# Patient Record
Sex: Male | Born: 1960 | Race: Black or African American | Hispanic: No | Marital: Single | Smoking: Never smoker
Health system: Southern US, Community
[De-identification: ages and names within clinical notes are randomized; demographics above are authoritative.]

## PROBLEM LIST (undated history)

## (undated) DIAGNOSIS — M5030 Other cervical disc degeneration, unspecified cervical region: Secondary | ICD-10-CM

## (undated) DIAGNOSIS — E785 Hyperlipidemia, unspecified: Secondary | ICD-10-CM

## (undated) HISTORY — DX: Hyperlipidemia, unspecified: E78.5

---
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
## 2009-04-26 ENCOUNTER — Emergency Department: Payer: Self-pay | Admitting: Emergency Medicine

## 2011-10-17 ENCOUNTER — Emergency Department: Payer: Self-pay | Admitting: Emergency Medicine

## 2014-02-08 ENCOUNTER — Ambulatory Visit: Payer: Self-pay | Admitting: Family Medicine

## 2014-09-20 ENCOUNTER — Telehealth: Payer: Self-pay | Admitting: Family Medicine

## 2014-09-20 DIAGNOSIS — E7800 Pure hypercholesterolemia, unspecified: Secondary | ICD-10-CM

## 2014-09-20 DIAGNOSIS — M779 Enthesopathy, unspecified: Secondary | ICD-10-CM

## 2014-09-20 NOTE — Telephone Encounter (Signed)
Requesting refill on meloxicam and lovastatin. Please send to [facility]. Please inform patient when this has been completed [phone]

## 2014-09-26 MED ORDER — MELOXICAM 15 MG PO TABS: 15.0000 mg | ORAL_TABLET | Freq: Every day | ORAL | Status: DC

## 2014-09-26 MED ORDER — LOVASTATIN 40 MG PO TABS: 40.0000 mg | ORAL_TABLET | Freq: Every day | ORAL | Status: DC

## 2014-09-26 NOTE — Telephone Encounter (Signed)
Sent enough for 1 month to pharmacy pt must RTO for refills as he has not been here since 03/2014.

## 2014-09-29 NOTE — Telephone Encounter (Signed)
Have tried contacting the patient multiple times and no response.

## 2014-10-10 ENCOUNTER — Telehealth: Payer: Self-pay | Admitting: Family Medicine

## 2014-10-10 ENCOUNTER — Telehealth: Payer: Self-pay | Admitting: Emergency Medicine

## 2014-10-10 DIAGNOSIS — E7800 Pure hypercholesterolemia, unspecified: Secondary | ICD-10-CM

## 2014-10-10 DIAGNOSIS — M779 Enthesopathy, unspecified: Secondary | ICD-10-CM

## 2014-10-10 MED ORDER — LOVASTATIN 40 MG PO TABS: 40.0000 mg | ORAL_TABLET | Freq: Every day | ORAL | Status: DC

## 2014-10-10 MED ORDER — MELOXICAM 15 MG PO TABS: 15.0000 mg | ORAL_TABLET | Freq: Every day | ORAL | Status: DC

## 2014-10-10 NOTE — Telephone Encounter (Signed)
Patient notified script is ready for pick up

## 2014-10-10 NOTE — Telephone Encounter (Signed)
Pt called needing a refill on the following medications:  1. Lovastatin 40mg  2. Meloxicam 15mg   Pt uses the [facility] on [address]. Pt stated that the pharmacy never received the refill from 09/26/14.

## 2015-03-13 ENCOUNTER — Encounter: Payer: Self-pay | Admitting: Family Medicine

## 2015-03-13 ENCOUNTER — Ambulatory Visit (INDEPENDENT_AMBULATORY_CARE_PROVIDER_SITE_OTHER): Payer: Managed Care, Other (non HMO) | Admitting: Family Medicine

## 2015-03-13 VITALS — BP 120/72 | HR 72 | Temp 98.8°F | Resp 18 | Ht 68.0 in | Wt 177.7 lb

## 2015-03-13 DIAGNOSIS — M25569 Pain in unspecified knee: Secondary | ICD-10-CM | POA: Diagnosis not present

## 2015-03-13 DIAGNOSIS — Z1211 Encounter for screening for malignant neoplasm of colon: Secondary | ICD-10-CM

## 2015-03-13 DIAGNOSIS — Z23 Encounter for immunization: Secondary | ICD-10-CM

## 2015-03-13 DIAGNOSIS — E785 Hyperlipidemia, unspecified: Secondary | ICD-10-CM | POA: Diagnosis not present

## 2015-03-13 DIAGNOSIS — M779 Enthesopathy, unspecified: Secondary | ICD-10-CM | POA: Diagnosis not present

## 2015-03-13 DIAGNOSIS — E7800 Pure hypercholesterolemia, unspecified: Secondary | ICD-10-CM | POA: Diagnosis not present

## 2015-03-13 DIAGNOSIS — Z0000 Encounter for general adult medical examination without abnormal findings: Secondary | ICD-10-CM | POA: Diagnosis not present

## 2015-03-13 MED ORDER — MELOXICAM 15 MG PO TABS: 15.0000 mg | ORAL_TABLET | Freq: Every day | ORAL | Status: DC

## 2015-03-13 MED ORDER — LOVASTATIN 40 MG PO TABS: 40.0000 mg | ORAL_TABLET | Freq: Every day | ORAL | Status: DC

## 2015-03-13 NOTE — Progress Notes (Signed)
Name: Juventino SlovakMark A Hartfield   MRN: [id number]    DOB: 08/21/1960   Date:03/13/2015       Progress Note  Subjective  Chief Complaint  Chief Complaint  Patient presents with  . Annual Exam    HPI  54 year old presents for annual H&P. Baseline problems are stable  Past Medical History  Diagnosis Date  . Hyperlipidemia     Social History  Substance Use Topics  . Smoking status: Never Smoker   . Smokeless tobacco: Not on file  . Alcohol Use: 0.0 oz/week    0 Standard drinks or equivalent per week     Current outpatient prescriptions:  .  lovastatin (MEVACOR) 40 MG tablet, Take 1 tablet (40 mg total) by mouth at bedtime., Disp: 30 tablet, Rfl: 2 .  meloxicam (MOBIC) 15 MG tablet, Take 1 tablet (15 mg total) by mouth daily., Disp: 30 tablet, Rfl: 2  No Known Allergies  Review of Systems  Constitutional: Negative for fever, chills and weight loss.  HENT: Negative for congestion, hearing loss, sore throat and tinnitus.   Eyes: Negative for blurred vision, double vision and redness.  Respiratory: Negative for cough, hemoptysis and shortness of breath.   Cardiovascular: Negative for chest pain, palpitations, orthopnea, claudication and leg swelling.  Gastrointestinal: Negative for heartburn, nausea, vomiting, diarrhea, constipation and blood in stool.  Genitourinary: Negative for dysuria, urgency, frequency and hematuria.  Musculoskeletal: Negative for myalgias, back pain, joint pain, falls and neck pain.  Skin: Negative for itching.  Neurological: Negative for dizziness, tingling, tremors, focal weakness, seizures, loss of consciousness, weakness and headaches.  Endo/Heme/Allergies: Does not bruise/bleed easily.  Psychiatric/Behavioral: Negative for depression and substance abuse. The patient is not nervous/anxious and does not have insomnia.      Objective  Filed Vitals:   03/13/15 0931  BP: 120/72  Pulse: 72  Temp: 98.8 F (37.1 C)  TempSrc: Oral  Resp: 18  Height: 5\' 8"   (1.727 m)  Weight: 177 lb 11.2 oz (80.604 kg)  SpO2: 97%     Physical Exam  Constitutional: He is oriented to person, place, and time and well-developed, well-nourished, and in no distress.  HENT:  Head: Normocephalic.  Eyes: EOM are normal. Pupils are equal, round, and reactive to light.  Neck: Normal range of motion. Neck supple. No thyromegaly present.  Cardiovascular: Normal rate, regular rhythm and normal heart sounds.   No murmur heard. Pulmonary/Chest: Effort normal and breath sounds normal. No respiratory distress. He has no wheezes.  Abdominal: Soft. Bowel sounds are normal.  Genitourinary: Rectum normal, prostate normal and penis normal. Guaiac negative stool. No discharge found.  Musculoskeletal: Normal range of motion. He exhibits no edema.  Lymphadenopathy:    He has no cervical adenopathy.  Neurological: He is alert and oriented to person, place, and time. No cranial nerve deficit. Gait normal. Coordination normal.  Skin: Skin is warm and dry. No rash noted.  Psychiatric: Affect and judgment normal.      Assessment & Plan   1. Annual physical exam  - POC Hemoccult Bld/Stl (1-Cd Office Dx) - CBC - Comprehensive Metabolic Panel (CMET) - Lipid panel - TSH - PSA  2. Hyperlipidemia  - Lipid panel  3. Knee pain, unspecified laterality Stable  4. Colon cancer screening  - Ambulatory referral to Gastroenterology  5. Need for influenza vaccination Given - Flu Vaccine QUAD 36+ mos PF IM (Fluarix & Fluzone Quad PF)  6. Hypercholesteremia Labs - lovastatin (MEVACOR) 40 MG tablet; Take 1 tablet (40  mg total) by mouth at bedtime.  Dispense: 30 tablet; Refill: 5  7. Tendonitis Renew meloxicam. Clinic AND was treated - meloxicam (MOBIC) 15 MG tablet; Take 1 tablet (15 mg total) by mouth daily.  Dispense: 30 tablet; Refill: 5

## 2015-03-14 LAB — LIPID PANEL
CHOLESTEROL TOTAL: 196 mg/dL (ref 100–199)
Chol/HDL Ratio: 4.4 ratio units (ref 0.0–5.0)
HDL: 45 mg/dL (ref >39)
LDL Calculated: 133 mg/dL — ABNORMAL HIGH (ref 0–99)
TRIGLYCERIDES: 90 mg/dL (ref 0–149)
VLDL CHOLESTEROL CAL: 18 mg/dL (ref 5–40)

## 2015-03-14 LAB — COMPREHENSIVE METABOLIC PANEL
A/G RATIO: 2 (ref 1.1–2.5)
ALBUMIN: 4.5 g/dL (ref 3.5–5.5)
ALK PHOS: 57 IU/L (ref 39–117)
ALT: 37 IU/L (ref 0–44)
AST: 29 IU/L (ref 0–40)
BILIRUBIN TOTAL: 0.7 mg/dL (ref 0.0–1.2)
BUN/Creatinine Ratio: 14 (ref 9–20)
BUN: 17 mg/dL (ref 6–24)
CO2: 26 mmol/L (ref 18–29)
CREATININE: 1.25 mg/dL (ref 0.76–1.27)
Calcium: 9.1 mg/dL (ref 8.7–10.2)
Chloride: 100 mmol/L (ref 96–106)
GFR calc Af Amer: 76 mL/min/{1.73_m2} (ref >59)
GFR calc non Af Amer: 66 mL/min/{1.73_m2} (ref >59)
GLUCOSE: 97 mg/dL (ref 65–99)
Globulin, Total: 2.2 g/dL (ref 1.5–4.5)
Potassium: 4.5 mmol/L (ref 3.5–5.2)
Sodium: 140 mmol/L (ref 134–144)
TOTAL PROTEIN: 6.7 g/dL (ref 6.0–8.5)

## 2015-03-14 LAB — CBC
HEMOGLOBIN: 15.2 g/dL (ref 12.6–17.7)
Hematocrit: 44.2 % (ref 37.5–51.0)
MCH: 29 pg (ref 26.6–33.0)
MCHC: 34.4 g/dL (ref 31.5–35.7)
MCV: 84 fL (ref 79–97)
Platelets: 185 10*3/uL (ref 150–379)
RBC: 5.24 x10E6/uL (ref 4.14–5.80)
RDW: 14.3 % (ref 12.3–15.4)
WBC: 6.7 10*3/uL (ref 3.4–10.8)

## 2015-03-14 LAB — TSH: TSH: 2.66 u[IU]/mL (ref 0.450–4.500)

## 2015-03-14 LAB — PSA: PROSTATE SPECIFIC AG, SERUM: 2 ng/mL (ref 0.0–4.0)

## 2015-03-17 ENCOUNTER — Other Ambulatory Visit: Payer: Self-pay

## 2015-03-17 ENCOUNTER — Telehealth: Payer: Self-pay | Admitting: Gastroenterology

## 2015-03-17 NOTE — Telephone Encounter (Signed)
Gastroenterology Pre-Procedure Review  Request Date: 05-22-15 Requesting Physician: Dr.   PATIENT REVIEW QUESTIONS: The patient responded to the following health history questions as indicated:    1. Are you having any GI issues? no 2. Do you have a personal history of Polyps? no 3. Do you have a family history of Colon Cancer or Polyps? no 4. Diabetes Mellitus? no 5. Joint replacements in the past 12 months?no 6. Major health problems in the past 3 months?no 7. Any artificial heart valves, MVP, or defibrillator?no    MEDICATIONS & ALLERGIES:    Patient reports the following regarding taking any anticoagulation/antiplatelet therapy:   Plavix, Coumadin, Eliquis, Xarelto, Lovenox, Pradaxa, Brilinta, or Effient? no Aspirin? no  Patient confirms/reports the following medications:  Current Outpatient Prescriptions  Medication Sig Dispense Refill   lovastatin (MEVACOR) 40 MG tablet Take 1 tablet (40 mg total) by mouth at bedtime. 30 tablet 5   meloxicam (MOBIC) 15 MG tablet Take 1 tablet (15 mg total) by mouth daily. 30 tablet 5   No current facility-administered medications for this visit.    Patient confirms/reports the following allergies:  No Known Allergies  No orders of the defined types were placed in this encounter.    AUTHORIZATION INFORMATION Primary Insurance: 1D#: Group #:  Secondary Insurance: 1D#: Group #:  SCHEDULE INFORMATION: Date:05-22-15  Time: Location:[facility]

## 2015-05-16 ENCOUNTER — Encounter: Payer: Self-pay | Admitting: *Deleted

## 2015-05-18 NOTE — Discharge Instructions (Signed)

## 2015-05-22 ENCOUNTER — Ambulatory Visit
Admission: RE | Admit: 2015-05-22 | Discharge: 2015-05-22 | Disposition: A | Discharge status: Home / Self Care | Payer: Managed Care, Other (non HMO) | Source: Ambulatory Visit | Attending: Gastroenterology | Admitting: Gastroenterology

## 2015-05-22 ENCOUNTER — Ambulatory Visit: Payer: Managed Care, Other (non HMO) | Admitting: Anesthesiology

## 2015-05-22 ENCOUNTER — Encounter
Admission: RE | Disposition: A | Discharge status: Home / Self Care | Payer: Self-pay | Source: Ambulatory Visit | Attending: Gastroenterology

## 2015-05-22 DIAGNOSIS — Z9889 Other specified postprocedural states: Secondary | ICD-10-CM | POA: Diagnosis not present

## 2015-05-22 DIAGNOSIS — K640 First degree hemorrhoids: Secondary | ICD-10-CM | POA: Diagnosis not present

## 2015-05-22 DIAGNOSIS — Z791 Long term (current) use of non-steroidal anti-inflammatories (NSAID): Secondary | ICD-10-CM | POA: Diagnosis not present

## 2015-05-22 DIAGNOSIS — Z1211 Encounter for screening for malignant neoplasm of colon: Secondary | ICD-10-CM | POA: Diagnosis present

## 2015-05-22 DIAGNOSIS — M5030 Other cervical disc degeneration, unspecified cervical region: Secondary | ICD-10-CM | POA: Diagnosis not present

## 2015-05-22 DIAGNOSIS — E785 Hyperlipidemia, unspecified: Secondary | ICD-10-CM | POA: Diagnosis not present

## 2015-05-22 DIAGNOSIS — Z79899 Other long term (current) drug therapy: Secondary | ICD-10-CM | POA: Diagnosis not present

## 2015-05-22 HISTORY — DX: Other cervical disc degeneration, unspecified cervical region: M50.30

## 2015-05-22 HISTORY — PX: COLONOSCOPY WITH PROPOFOL: SHX5780

## 2015-05-22 SURGERY — COLONOSCOPY WITH PROPOFOL
Anesthesia: Monitor Anesthesia Care | Wound class: Contaminated

## 2015-05-22 MED ORDER — LIDOCAINE HCL (CARDIAC) 20 MG/ML IV SOLN
INTRAVENOUS | Status: DC | PRN
  Administered 2015-05-22: 50 mg via INTRAVENOUS

## 2015-05-22 MED ORDER — LACTATED RINGERS IV SOLN
INTRAVENOUS | Status: DC
  Administered 2015-05-22: 08:00:00 via INTRAVENOUS

## 2015-05-22 MED ORDER — PROPOFOL 10 MG/ML IV BOLUS
INTRAVENOUS | Status: DC | PRN
  Administered 2015-05-22 (×3): 20 mg via INTRAVENOUS
  Administered 2015-05-22: 70 mg via INTRAVENOUS
  Administered 2015-05-22: 10 mg via INTRAVENOUS

## 2015-05-22 SURGICAL SUPPLY — 28 items

## 2015-05-22 NOTE — Transfer of Care (Signed)
Immediate Anesthesia Transfer of Care Note  Patient: Clarence Martinez  Procedure(s) Performed: Procedure(s): COLONOSCOPY WITH PROPOFOL (N/A)  Patient Location: PACU  Anesthesia Type: MAC  Level of Consciousness: awake, alert  and patient cooperative  Airway and Oxygen Therapy: Patient Spontanous Breathing and Patient connected to supplemental oxygen  Post-op Assessment: Post-op Vital signs reviewed, Patient's Cardiovascular Status Stable, Respiratory Function Stable, Patent Airway and No signs of Nausea or vomiting  Post-op Vital Signs: Reviewed and stable  Complications: No apparent anesthesia complications

## 2015-05-22 NOTE — Anesthesia Preprocedure Evaluation (Signed)
Anesthesia Evaluation  Patient identified by MRN, date of birth, ID band Patient awake    Reviewed: Allergy & Precautions, H&P , NPO status , Patient's Chart, lab work & pertinent test results, reviewed documented beta blocker date and time   Airway Mallampati: II  TM Distance: >3 FB Neck ROM: full    Dental no notable dental hx.    Pulmonary neg pulmonary ROS,    Pulmonary exam normal breath sounds clear to auscultation       Cardiovascular Exercise Tolerance: Good negative cardio ROS   Rhythm:regular Rate:Normal     Neuro/Psych negative neurological ROS  negative psych ROS   GI/Hepatic negative GI ROS, Neg liver ROS,   Endo/Other  negative endocrine ROS  Renal/GU negative Renal ROS  negative genitourinary   Musculoskeletal   Abdominal   Peds  Hematology negative hematology ROS (+)   Anesthesia Other Findings   Reproductive/Obstetrics negative OB ROS                             Anesthesia Physical Anesthesia Plan  ASA: I  Anesthesia Plan: MAC   Post-op Pain Management:    Induction:   Airway Management Planned:   Additional Equipment:   Intra-op Plan:   Post-operative Plan:   Informed Consent: I have reviewed the patients History and Physical, chart, labs and discussed the procedure including the risks, benefits and alternatives for the proposed anesthesia with the patient or authorized representative who has indicated his/her understanding and acceptance.   Dental Advisory Given  Plan Discussed with: CRNA  Anesthesia Plan Comments:         Anesthesia Quick Evaluation  

## 2015-05-22 NOTE — Anesthesia Procedure Notes (Signed)
Procedure Name: MAC Performed by: Jenniferann Stuckert Pre-anesthesia Checklist: Patient identified, Emergency Drugs available, Suction available, Timeout performed and Patient being monitored Patient Re-evaluated:Patient Re-evaluated prior to inductionOxygen Delivery Method: Nasal cannula Placement Confirmation: positive ETCO2       

## 2015-05-22 NOTE — H&P (Signed)
  [facility]  [address], [address] [place], [place] [address] Phone: [phone] Fax : [phone]  Primary Care Physician:  Dennison Mascot, MD Primary Gastroenterologist:  Dr. Servando Snare  Pre-Procedure History & Physical: HPI:  Clarence Martinez is a 55 y.o. male is here for a screening colonoscopy.   Past Medical History  Diagnosis Date  . Hyperlipidemia   . Degenerative disc disease, cervical     no limitations    Past Surgical History  Procedure Laterality Date  . Nose surgery  1977    Prior to Admission medications   Medication Sig Start Date End Date Taking? Authorizing Provider  lovastatin (MEVACOR) 40 MG tablet Take 1 tablet (40 mg total) by mouth at bedtime. 03/13/15  Yes Dennison Mascot, MD  meloxicam (MOBIC) 15 MG tablet Take 1 tablet (15 mg total) by mouth daily. 03/13/15  Yes Dennison Mascot, MD    Allergies as of 03/17/2015  . (No Known Allergies)    History reviewed. No pertinent family history.  Social History   Social History  . Marital Status: Single    Spouse Name: N/A  . Number of Children: N/A  . Years of Education: N/A   Occupational History  . Not on file.   Social History Main Topics  . Smoking status: Never Smoker   . Smokeless tobacco: Not on file  . Alcohol Use: 0.0 oz/week    0 Standard drinks or equivalent per week     Comment: rare - 1 drink/mo  . Drug Use: No  . Sexual Activity:    Partners: Female   Other Topics Concern  . Not on file   Social History Narrative    Review of Systems: See HPI, otherwise negative ROS  Physical Exam: Ht  (1.727 m)  Wt 178 lb (80.74 kg)  BMI 27.07 kg/m2 General:   Alert,  pleasant and cooperative in NAD Head:  Normocephalic and atraumatic. Neck:  Supple; no masses or thyromegaly. Lungs:  Clear throughout to auscultation.    Heart:  Regular rate and rhythm. Abdomen:  Soft, nontender and nondistended. Normal bowel sounds, without guarding, and without rebound.   Neurologic:   Alert and  oriented x4;  grossly normal neurologically.  Impression/Plan: Clarence Martinez is now here to undergo a screening colonoscopy.  Risks, benefits, and alternatives regarding colonoscopy have been reviewed with the patient.  Questions have been answered.  All parties agreeable.

## 2015-05-22 NOTE — Op Note (Signed)
[facility] Patient Name: Clarence Martinez Procedure Date: 05/22/2015 7:29 AM MRN: [id number] Account #: [id number] Date of Birth: 1960/09/18 Admit Type: Outpatient Age: 55 Room: [facility] OR ROOM 01 Gender: Male Note Status: Finalized Procedure:            Colonoscopy Indications:          Screening for colorectal malignant neoplasm Providers:            Midge Minium, MD Referring MD:         Dennison Mascot, MD (Referring MD) Medicines:            Propofol per Anesthesia Complications:        No immediate complications. Procedure:            Pre-Anesthesia Assessment:                       - Prior to the procedure, a History and Physical was                        performed, and patient medications and allergies were                        reviewed. The patient's tolerance of previous                        anesthesia was also reviewed. The risks and benefits of                        the procedure and the sedation options and risks were                        discussed with the patient. All questions were                        answered, and informed consent was obtained. Prior                        Anticoagulants: The patient has taken no previous                        anticoagulant or antiplatelet agents. ASA Grade                        Assessment: II - A patient with mild systemic disease.                        After reviewing the risks and benefits, the patient was                        deemed in satisfactory condition to undergo the                        procedure.                       After obtaining informed consent, the colonoscope was                        passed under direct vision. Throughout the procedure,  the patient's blood pressure, pulse, and oxygen                        saturations were monitored continuously. The was                        introduced through the anus and advanced to the the         cecum, identified by appendiceal orifice and ileocecal                        valve. The colonoscopy was performed without                        difficulty. The patient tolerated the procedure well.                        The quality of the bowel preparation was excellent. Findings:      The perianal and digital rectal examinations were normal.      Non-bleeding internal hemorrhoids were found during retroflexion. The       hemorrhoids were Grade I (internal hemorrhoids that do not prolapse). Impression:           - Non-bleeding internal hemorrhoids.                       - No specimens collected. Recommendation:       - Repeat colonoscopy in 10 years for screening unless                        any change in family history or lower GI problems. Procedure Code(s):    --- Professional ---                       [phone], Colonoscopy, flexible; diagnostic, including                        collection of specimen(s) by brushing or washing, when                        performed (separate procedure) Diagnosis Code(s):    --- Professional ---                       Z12.11, Encounter for screening for malignant neoplasm                        of colon CPT copyright 2016 American Medical Association. All rights reserved. The codes documented in this report are preliminary and upon coder review may  be revised to meet current compliance requirements. Midge Minium, MD 05/22/2015 8:05:16 AM This report has been signed electronically. Number of Addenda: 0 Note Initiated On: 05/22/2015 7:29 AM Scope Withdrawal Time: 0 hours 6 minutes 16 seconds  Total Procedure Duration: 0 hours 8 minutes 12 seconds       [facility]

## 2015-05-22 NOTE — Anesthesia Postprocedure Evaluation (Signed)
Anesthesia Post Note  Patient: Clarence Martinez  Procedure(s) Performed: Procedure(s) (LRB): COLONOSCOPY WITH PROPOFOL (N/A)  Patient location during evaluation: PACU Anesthesia Type: MAC Level of consciousness: awake and alert Pain management: pain level controlled Vital Signs Assessment: post-procedure vital signs reviewed and stable Respiratory status: spontaneous breathing, nonlabored ventilation, respiratory function stable and patient connected to nasal cannula oxygen Cardiovascular status: blood pressure returned to baseline and stable Postop Assessment: no signs of nausea or vomiting Anesthetic complications: no    Scarlette Slice

## 2015-05-23 ENCOUNTER — Encounter: Payer: Self-pay | Admitting: Gastroenterology

## 2015-09-11 ENCOUNTER — Ambulatory Visit: Payer: Managed Care, Other (non HMO) | Admitting: Family Medicine

## 2016-05-08 ENCOUNTER — Encounter: Payer: Managed Care, Other (non HMO) | Admitting: Family Medicine

## 2016-05-19 IMAGING — CR CERVICAL SPINE - COMPLETE 4+ VIEW
3 series · 9 of 9 positions shown · non-contrast
Comparison: None

CLINICAL DATA: Neck pain mostly on LEFT side, shoulder pain

EXAM:
CERVICAL SPINE  4+ VIEWS

[kdxr c-spine complete (1 of 3)]
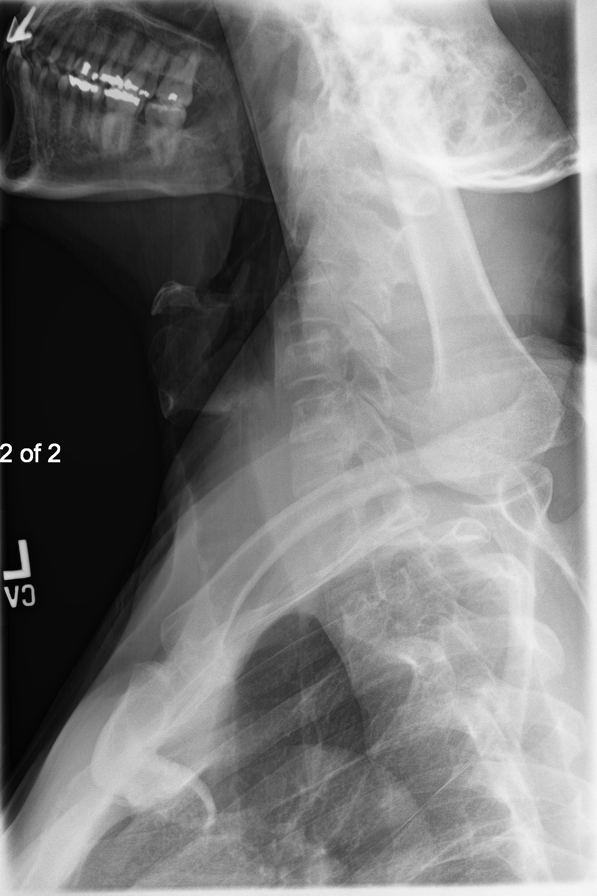

[kdxr c-spine complete (2 of 3)]
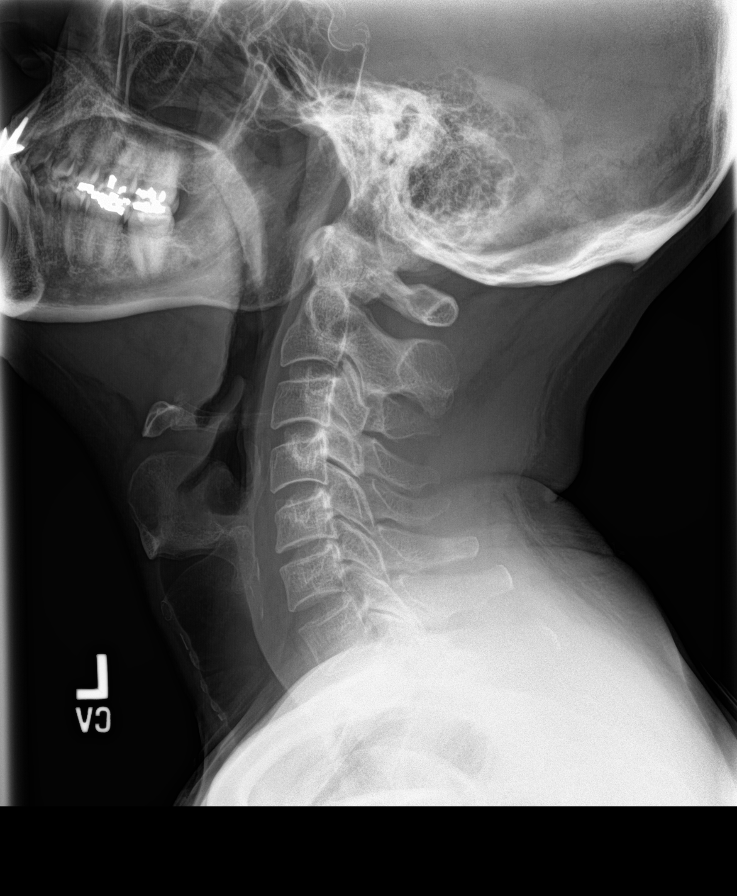

[Series 1: kdxr c-spine complete · 0.14mm/px · 7 of 7 slices shown (3 of 3)]
[im 1/7]
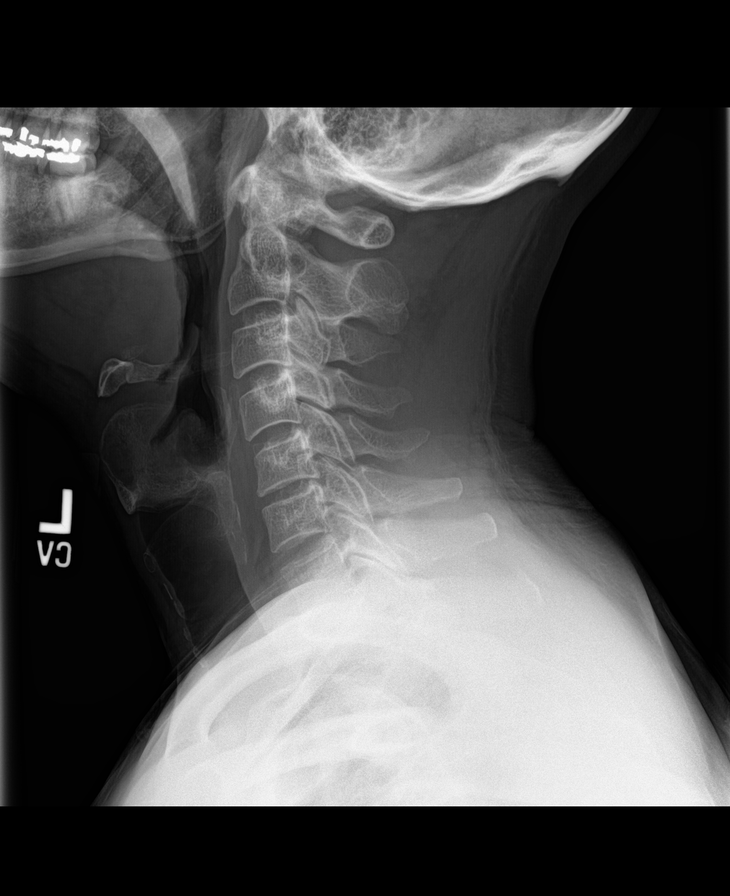
[im 2/7]
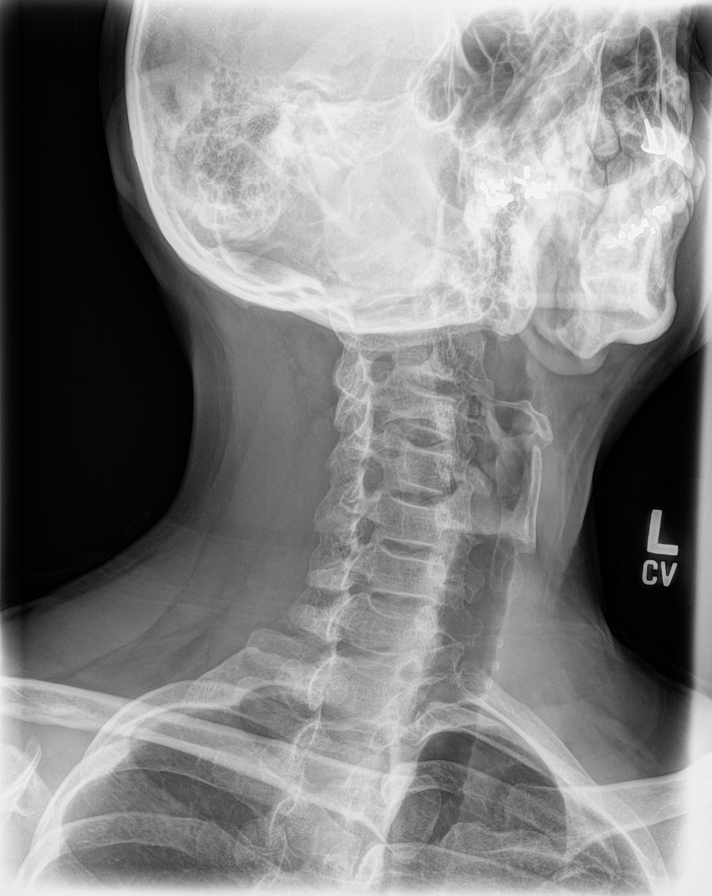
[im 3/7]
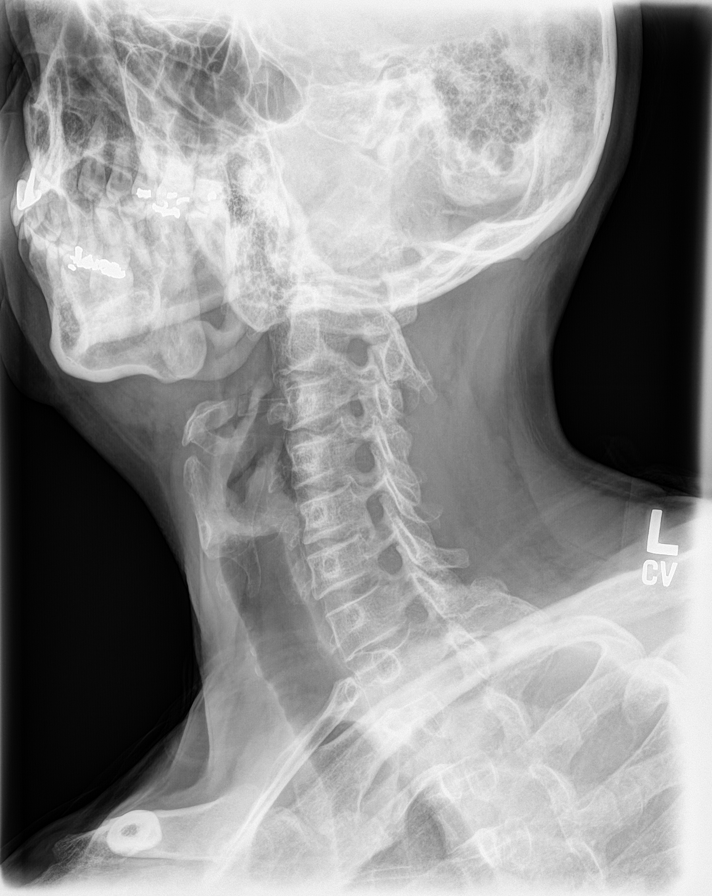
[im 4/7]
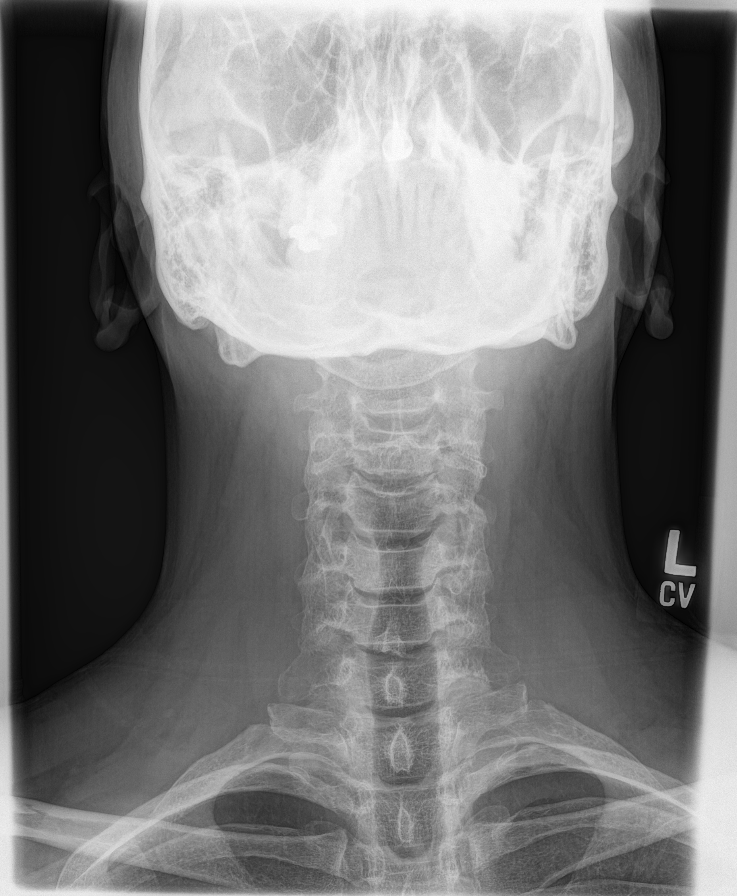
[im 5/7]
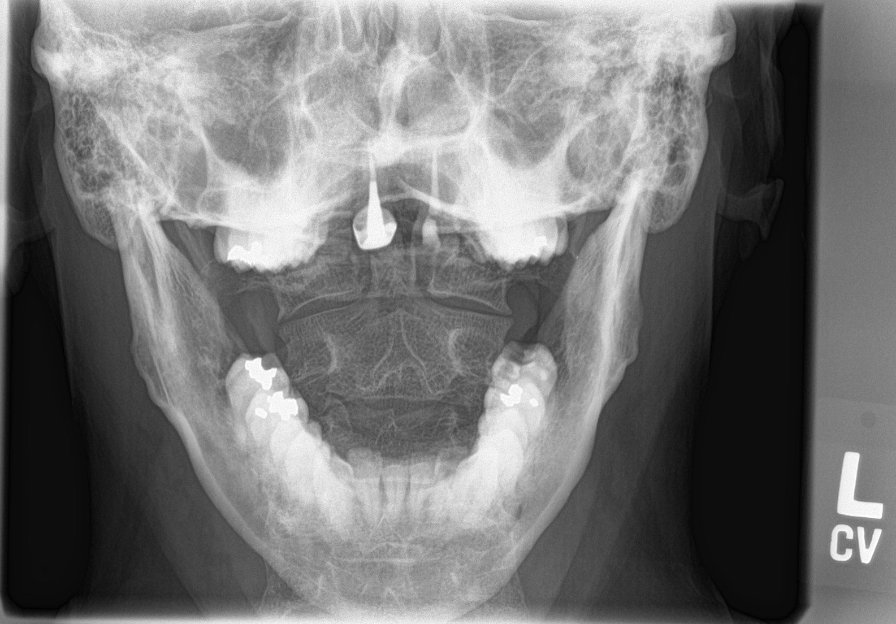
[im 6/7]
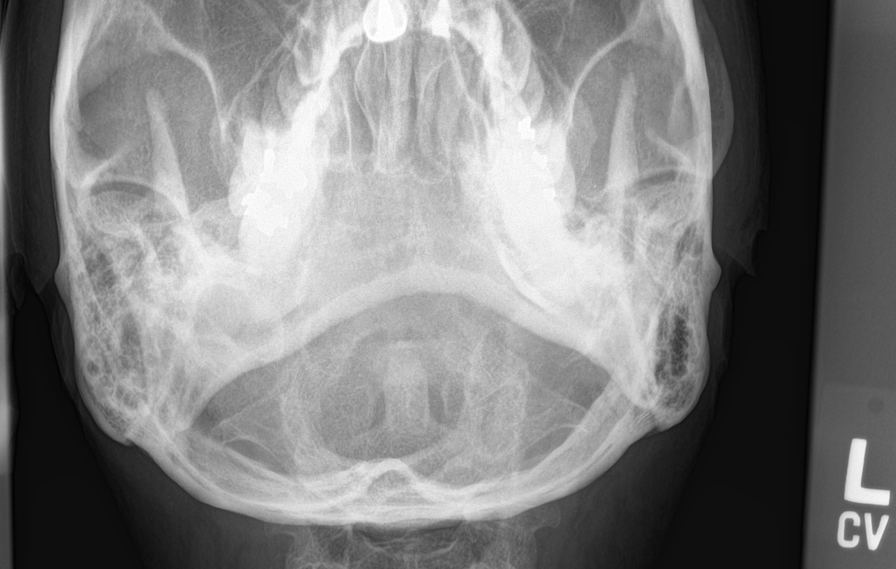
[im 7/7]
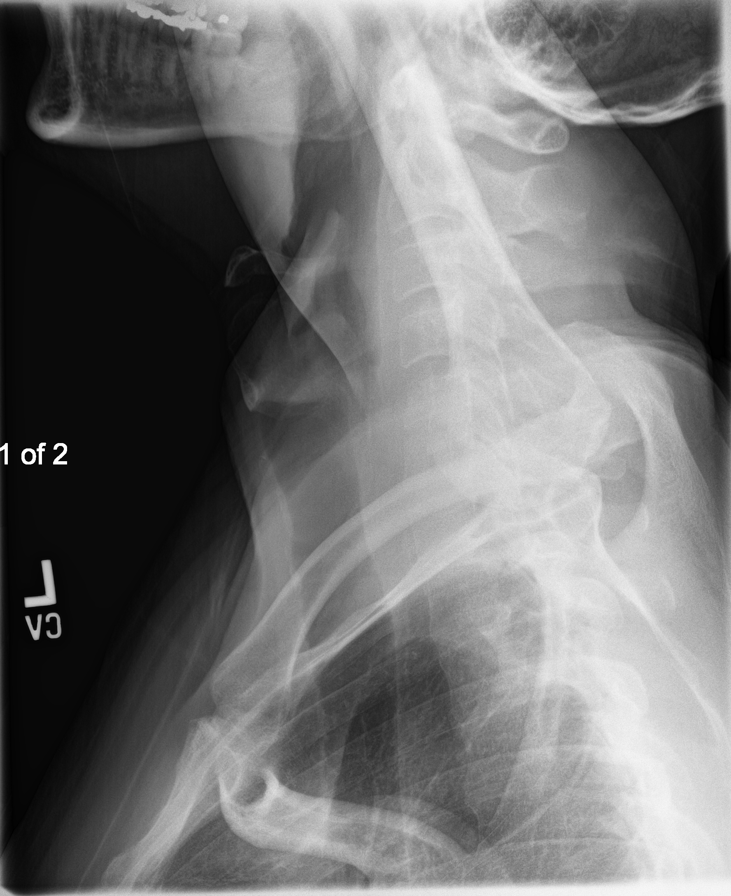

[9 of 9 positions shown; findings below may reference images not displayed]

FINDINGS: Prevertebral soft tissues normal thickness.

Question slight diffuse osseous demineralization.

Minimal disc space narrowing at C5-C6.

Vertebral body and disc space heights otherwise maintained.

No acute fracture, subluxation, or bone destruction.

Bony foramina grossly patent.

Visualized lung apices clear.
IMPRESSION: Minimal degenerative disc disease changes at C5-C6.

## 2016-06-11 ENCOUNTER — Encounter: Payer: Self-pay | Admitting: Family Medicine

## 2016-06-11 ENCOUNTER — Ambulatory Visit (INDEPENDENT_AMBULATORY_CARE_PROVIDER_SITE_OTHER): Payer: Managed Care, Other (non HMO) | Admitting: Family Medicine

## 2016-06-11 ENCOUNTER — Encounter: Payer: Managed Care, Other (non HMO) | Admitting: Family Medicine

## 2016-06-11 VITALS — BP 140/70 | HR 72 | Temp 97.6°F | Resp 16 | Ht 68.0 in | Wt 179.8 lb

## 2016-06-11 DIAGNOSIS — M5030 Other cervical disc degeneration, unspecified cervical region: Secondary | ICD-10-CM | POA: Diagnosis not present

## 2016-06-11 DIAGNOSIS — E7800 Pure hypercholesterolemia, unspecified: Secondary | ICD-10-CM | POA: Diagnosis not present

## 2016-06-11 DIAGNOSIS — R2231 Localized swelling, mass and lump, right upper limb: Secondary | ICD-10-CM

## 2016-06-11 LAB — COMPLETE METABOLIC PANEL WITH GFR
ALBUMIN: 4.4 g/dL (ref 3.6–5.1)
ALK PHOS: 55 U/L (ref 40–115)
ALT: 30 U/L (ref 9–46)
AST: 20 U/L (ref 10–35)
BILIRUBIN TOTAL: 0.5 mg/dL (ref 0.2–1.2)
BUN: 16 mg/dL (ref 7–25)
CO2: 28 mmol/L (ref 20–31)
CREATININE: 1.34 mg/dL — AB (ref 0.70–1.33)
Calcium: 8.8 mg/dL (ref 8.6–10.3)
Chloride: 105 mmol/L (ref 98–110)
GFR, EST NON AFRICAN AMERICAN: 60 mL/min (ref >=60)
GFR, Est African American: 69 mL/min (ref >=60)
GLUCOSE: 91 mg/dL (ref 65–99)
Potassium: 4.7 mmol/L (ref 3.5–5.3)
SODIUM: 138 mmol/L (ref 135–146)
TOTAL PROTEIN: 6.9 g/dL (ref 6.1–8.1)

## 2016-06-11 LAB — LIPID PANEL
Cholesterol: 219 mg/dL — ABNORMAL HIGH (ref <200)
HDL: 39 mg/dL — AB (ref >40)
LDL Cholesterol: 150 mg/dL — ABNORMAL HIGH (ref <100)
Total CHOL/HDL Ratio: 5.6 Ratio — ABNORMAL HIGH (ref <5.0)
Triglycerides: 150 mg/dL — ABNORMAL HIGH (ref <150)
VLDL: 30 mg/dL (ref <30)

## 2016-06-11 MED ORDER — MELOXICAM 15 MG PO TABS: 15.0000 mg | ORAL_TABLET | Freq: Every day | ORAL | 0 refills | Status: DC

## 2016-06-11 MED ORDER — LOVASTATIN 40 MG PO TABS: 40.0000 mg | ORAL_TABLET | Freq: Every day | ORAL | 0 refills | Status: DC

## 2016-06-11 NOTE — Progress Notes (Signed)
Name: Clarence Martinez   MRN: [id number]    DOB: 11/10/1960   Date:06/11/2016       Progress Note  Subjective  Chief Complaint  Chief Complaint  Patient presents with  . Annual Exam    Hyperlipidemia  This is a chronic problem. The problem is uncontrolled. Recent lipid tests were reviewed and are high. Pertinent negatives include no chest pain, leg pain, myalgias or shortness of breath. Current antihyperlipidemic treatment includes statins. Risk factors for coronary artery disease include dyslipidemia.  Neck Pain   This is a chronic problem. The problem occurs intermittently. The quality of the pain is described as aching. The pain is at a severity of 3/10. The symptoms are aggravated by bending (strenuous activity such as working out in [employer]). Pertinent negatives include no chest pain, fever or leg pain. He has tried NSAIDs for the symptoms. The treatment provided significant relief.   Patient also has a mass on his right pinky finger, first noticed 2 years ago, does not hurt, has slightly increased in size slightly, no hindrance to movement of the finger.    Past Medical History:  Diagnosis Date  . Degenerative disc disease, cervical    no limitations  . Hyperlipidemia     Past Surgical History:  Procedure Laterality Date  . COLONOSCOPY WITH PROPOFOL N/A 05/22/2015   Procedure: COLONOSCOPY WITH PROPOFOL;  Surgeon: Midge Minium, MD;  Location: [facility];  Service: Endoscopy;  Laterality: N/A;  . NOSE SURGERY  1977    No family history on file.  Social History   Social History  . Marital status: Single    Spouse name: N/A  . Number of children: N/A  . Years of education: N/A   Occupational History  . Not on file.   Social History Main Topics  . Smoking status: Never Smoker  . Smokeless tobacco: Never Used  . Alcohol use 0.0 oz/week     Comment: rare - 1 drink/mo  . Drug use: No  . Sexual activity: Yes    Partners: Female   Other Topics Concern  . Not on  file   Social History Narrative  . No narrative on file     Current Outpatient Prescriptions:  .  lovastatin (MEVACOR) 40 MG tablet, Take 1 tablet (40 mg total) by mouth at bedtime., Disp: 30 tablet, Rfl: 5 .  meloxicam (MOBIC) 15 MG tablet, Take 1 tablet (15 mg total) by mouth daily., Disp: 30 tablet, Rfl: 5  No Known Allergies   Review of Systems  Constitutional: Negative for chills, fever and malaise/fatigue.  Respiratory: Negative for cough and shortness of breath.   Cardiovascular: Negative for chest pain.  Gastrointestinal: Negative for abdominal pain.  Musculoskeletal: Positive for neck pain. Negative for back pain and myalgias.      Objective  Vitals:   06/11/16 1032  BP: 140/70  Pulse: 72  Resp: 16  Temp: 97.6 F (36.4 C)  TempSrc: Oral  SpO2: 97%  Weight: 179 lb 12.8 oz (81.6 kg)  Height: 5\' 8"  (1.727 m)    Physical Exam  Constitutional: He is oriented to person, place, and time and well-developed, well-nourished, and in no distress.  Cardiovascular: Normal rate, regular rhythm, S1 normal and S2 normal.   No murmur heard. Pulmonary/Chest: Effort normal and breath sounds normal. He has no wheezes. He has no rhonchi.  Abdominal: Soft. Bowel sounds are normal. There is no tenderness.  Musculoskeletal:       Right ankle: He exhibits  no swelling.       Left ankle: He exhibits no swelling.       Hands: Neurological: He is alert and oriented to person, place, and time.  Nursing note and vitals reviewed.    Assessment & Plan  1. Degenerative disc disease, cervical Reviewed x-rays of the cervical spine, continue on meloxicam as needed - meloxicam (MOBIC) 15 MG tablet; Take 1 tablet (15 mg total) by mouth daily.  Dispense: 90 tablet; Refill: 0  2. Pure hypercholesterolemia  - lovastatin (MEVACOR) 40 MG tablet; Take 1 tablet (40 mg total) by mouth at bedtime.  Dispense: 90 tablet; Refill: 0 - Lipid panel - COMPLETE METABOLIC PANEL WITH GFR  3. Mass of  finger of right hand  - Ambulatory referral to Orthopedic Surgery   Bryann Gentz Asad A. Faylene KurtzShah [facility] 06/11/2016 10:49 AM

## 2016-06-27 ENCOUNTER — Encounter: Payer: Managed Care, Other (non HMO) | Admitting: Family Medicine

## 2016-07-03 ENCOUNTER — Ambulatory Visit (INDEPENDENT_AMBULATORY_CARE_PROVIDER_SITE_OTHER): Payer: Managed Care, Other (non HMO) | Admitting: Family Medicine

## 2016-07-03 ENCOUNTER — Encounter: Payer: Self-pay | Admitting: Family Medicine

## 2016-07-03 VITALS — BP 141/80 | HR 66 | Temp 98.3°F | Resp 16 | Ht 68.0 in | Wt 178.9 lb

## 2016-07-03 DIAGNOSIS — E7800 Pure hypercholesterolemia, unspecified: Secondary | ICD-10-CM

## 2016-07-03 DIAGNOSIS — Z0000 Encounter for general adult medical examination without abnormal findings: Secondary | ICD-10-CM | POA: Insufficient documentation

## 2016-07-03 DIAGNOSIS — R2231 Localized swelling, mass and lump, right upper limb: Secondary | ICD-10-CM | POA: Diagnosis not present

## 2016-07-03 LAB — CBC WITH DIFFERENTIAL/PLATELET
BASOS PCT: 0 %
Basophils Absolute: 0 cells/uL (ref 0–200)
EOS ABS: 122 {cells}/uL (ref 15–500)
Eosinophils Relative: 2 %
HCT: 44 % (ref 38.5–50.0)
Hemoglobin: 14.5 g/dL (ref 13.2–17.1)
Lymphocytes Relative: 34 %
Lymphs Abs: 2074 cells/uL (ref 850–3900)
MCH: 28.1 pg (ref 27.0–33.0)
MCHC: 33 g/dL (ref 32.0–36.0)
MCV: 85.3 fL (ref 80.0–100.0)
MONOS PCT: 9 %
MPV: 11 fL (ref 7.5–12.5)
Monocytes Absolute: 549 cells/uL (ref 200–950)
NEUTROS ABS: 3355 {cells}/uL (ref 1500–7800)
Neutrophils Relative %: 55 %
PLATELETS: 177 10*3/uL (ref 140–400)
RBC: 5.16 MIL/uL (ref 4.20–5.80)
RDW: 14.5 % (ref 11.0–15.0)
WBC: 6.1 10*3/uL (ref 3.8–10.8)

## 2016-07-03 LAB — TSH: TSH: 2.58 m[IU]/L (ref 0.40–4.50)

## 2016-07-03 MED ORDER — ATORVASTATIN CALCIUM 20 MG PO TABS: 20.0000 mg | ORAL_TABLET | Freq: Every day | ORAL | 0 refills | Status: DC

## 2016-07-03 NOTE — Progress Notes (Signed)
Name: Clarence Martinez   MRN: [id number]    DOB: 1960/05/07   Date:07/03/2016       Progress Note  Subjective  Chief Complaint  Chief Complaint  Patient presents with  . Annual Exam    CPE    HPI  Pt. Presents for Complete Physical Exam. He is due for PSA and DRE. Last colonoscopy was February 2017, repeat in 9 years.  Past Medical History:  Diagnosis Date  . Degenerative disc disease, cervical    no limitations  . Hyperlipidemia     Past Surgical History:  Procedure Laterality Date  . COLONOSCOPY WITH PROPOFOL N/A 05/22/2015   Procedure: COLONOSCOPY WITH PROPOFOL;  Surgeon: Midge Minium, MD;  Location: [facility];  Service: Endoscopy;  Laterality: N/A;  . NOSE SURGERY  1977    History reviewed. No pertinent family history.  Social History   Social History  . Marital status: Single    Spouse name: N/A  . Number of children: N/A  . Years of education: N/A   Occupational History  . Not on file.   Social History Main Topics  . Smoking status: Never Smoker  . Smokeless tobacco: Never Used  . Alcohol use 0.0 oz/week     Comment: rare - 1 drink/mo  . Drug use: No  . Sexual activity: Yes    Partners: Female   Other Topics Concern  . Not on file   Social History Narrative  . No narrative on file     Current Outpatient Prescriptions:  .  lovastatin (MEVACOR) 40 MG tablet, Take 1 tablet (40 mg total) by mouth at bedtime., Disp: 90 tablet, Rfl: 0 .  meloxicam (MOBIC) 15 MG tablet, Take 1 tablet (15 mg total) by mouth daily., Disp: 90 tablet, Rfl: 0 .  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 Negative for chills and fever.  HENT: Positive for congestion (nasal congestion). Negative for sore throat.   Eyes: Negative for blurred vision and double vision.  Respiratory: Negative for cough, sputum production and shortness of breath.    Cardiovascular: Negative for chest pain and palpitations.  Gastrointestinal: Negative for blood in stool, nausea and vomiting.  Genitourinary: Negative for dysuria, frequency and hematuria.  Musculoskeletal: Positive for neck pain (chronic intermittent neck pain). Negative for back pain.  Skin: Negative for itching and rash.  Neurological: Negative for dizziness and headaches.  Psychiatric/Behavioral: Negative for depression. The patient is not nervous/anxious and does not have insomnia.     Objective  Vitals:   07/03/16 1059  BP: (!) 141/80  Pulse: 66  Resp: 16  Temp: 98.3 F (36.8 C)  TempSrc: Oral  SpO2: 98%  Weight: 178 lb 14.4 oz (81.1 kg)  Height:  (1.727 m)    Physical Exam  Constitutional: He is oriented to person, place, and time and well-developed, well-nourished, and in no distress.  HENT:  Head: Normocephalic and atraumatic.  Right Ear: Tympanic membrane and ear canal normal.  Left Ear: Tympanic membrane and ear canal normal.  Mouth/Throat: No posterior oropharyngeal erythema.  Nasal turbinate hypertrophy  Cardiovascular: Normal rate, regular rhythm and normal heart sounds.   No murmur heard. Pulmonary/Chest: Effort normal and breath sounds normal. He has no wheezes.  Abdominal: Soft. Bowel sounds are normal. There is no tenderness.  Genitourinary: Rectum normal and prostate normal. Rectal exam shows no tenderness. Prostate is not  enlarged and not tender.  Neurological: He is alert and oriented to person, place, and time.  Skin: Skin is warm, dry and intact.  Psychiatric: Mood, memory, affect and judgment normal.  Nursing note and vitals reviewed.      Assessment & Plan  1. Annual physical exam  - CBC with Differential/Platelet - TSH - VITAMIN D 25 Hydroxy (Vit-D Deficiency, Fractures) - PSA  2. Pure hypercholesterolemia Abnormal FLP in March 2018, DC lovastatin and start on atorvastatin 20 mg at bedtime, repeat in 3 months - atorvastatin  (LIPITOR) 20 MG tablet; Take 1 tablet (20 mg total) by mouth daily.  Dispense: 90 tablet; Refill: 0   3. Mass of finger of right hand Status post surgical excision of the mass, sent for pathology,from: Orthopedics were reviewed  Kensi Karr [facility] 07/03/2016 11:12 AM

## 2016-07-04 LAB — PSA: PSA: 2 ng/mL (ref <=4.0)

## 2016-07-04 LAB — VITAMIN D 25 HYDROXY (VIT D DEFICIENCY, FRACTURES): VIT D 25 HYDROXY: 21 ng/mL — AB (ref 30–100)

## 2016-07-05 ENCOUNTER — Telehealth: Payer: Self-pay

## 2016-07-05 MED ORDER — VITAMIN D (ERGOCALCIFEROL) 1.25 MG (50000 UNIT) PO CAPS: 50000.0000 [IU] | ORAL_CAPSULE | ORAL | 0 refills | Status: AC

## 2016-07-05 NOTE — Telephone Encounter (Signed)
Patient has been notified of lab results and a prescription for vitamin D3 50,000 units take 1 capsule once a week x12 weeks has been sent to [facility] [place] per Dr. Sherryll Burger, patient has been notified

## 2016-10-09 ENCOUNTER — Ambulatory Visit: Payer: Managed Care, Other (non HMO) | Admitting: Family Medicine

## 2016-10-17 ENCOUNTER — Ambulatory Visit (INDEPENDENT_AMBULATORY_CARE_PROVIDER_SITE_OTHER): Payer: Managed Care, Other (non HMO) | Admitting: Family Medicine

## 2016-10-17 ENCOUNTER — Encounter: Payer: Self-pay | Admitting: Family Medicine

## 2016-10-17 VITALS — BP 140/75 | HR 64 | Temp 97.5°F | Resp 16 | Ht 68.0 in | Wt 183.1 lb

## 2016-10-17 DIAGNOSIS — E559 Vitamin D deficiency, unspecified: Secondary | ICD-10-CM | POA: Diagnosis not present

## 2016-10-17 DIAGNOSIS — E7800 Pure hypercholesterolemia, unspecified: Secondary | ICD-10-CM | POA: Diagnosis not present

## 2016-10-17 DIAGNOSIS — M5030 Other cervical disc degeneration, unspecified cervical region: Secondary | ICD-10-CM

## 2016-10-17 MED ORDER — ATORVASTATIN CALCIUM 20 MG PO TABS: 20.0000 mg | ORAL_TABLET | Freq: Every day | ORAL | 0 refills | Status: DC

## 2016-10-17 MED ORDER — MELOXICAM 15 MG PO TABS: 15.0000 mg | ORAL_TABLET | Freq: Every day | ORAL | 0 refills | Status: DC

## 2016-10-17 NOTE — Progress Notes (Signed)
Name: Clarence Martinez   MRN: [id number]    DOB: 04/06/1960   Date:10/17/2016       Progress Note  Subjective  Chief Complaint  Chief Complaint  Patient presents with  . Follow-up    3 mo  . Medication Refill    Hyperlipidemia  This is a chronic problem. The problem is uncontrolled. Recent lipid tests were reviewed and are high. Pertinent negatives include no focal weakness, myalgias or shortness of breath. Current antihyperlipidemic treatment includes statins (He has been taking Lovastatin becasue had a previous supply of the medication for 90 days). Risk factors for coronary artery disease include dyslipidemia.  Neck Pain   This is a chronic problem. The problem has been unchanged. The pain is present in the midline. The quality of the pain is described as aching. The pain is at a severity of 0/10. Stiffness is present all day. He has tried NSAIDs and oral narcotics for the symptoms.     Past Medical History:  Diagnosis Date  . Degenerative disc disease, cervical    no limitations  . Hyperlipidemia     Past Surgical History:  Procedure Laterality Date  . COLONOSCOPY WITH PROPOFOL N/A 05/22/2015   Procedure: COLONOSCOPY WITH PROPOFOL;  Surgeon: Midge Miniumarren Wohl, MD;  [facility];  Service: Endoscopy;  Laterality: N/A;  . NOSE SURGERY  1977    History reviewed. No pertinent family history.  Social History   Social History  . Marital status: Single    Spouse name: N/A  . Number of children: N/A  . Years of education: N/A   Occupational History  . Not on file.   Social History Main Topics  . Smoking status: Never Smoker  . Smokeless tobacco: Never Used  . Alcohol use 0.0 oz/week     Comment: rare - 1 drink/mo  . Drug use: No  . Sexual activity: Yes    Partners: Female   Other Topics Concern  . Not on file   Social History Narrative  . No narrative on file     Current Outpatient Prescriptions:  .  atorvastatin (LIPITOR) 20 MG tablet, Take 1 tablet  (20 mg total) by mouth daily., Disp: 90 tablet, Rfl: 0 .  meloxicam (MOBIC) 15 MG tablet, Take 1 tablet (15 mg total) by mouth daily., Disp: 90 tablet, Rfl: 0 .  traMADol (ULTRAM) 50 MG tablet, , Disp: , Rfl:  .  Vitamin D, Ergocalciferol, (DRISDOL) 50000 units CAPS capsule, Take 1 capsule (50,000 Units total) by mouth once a week. For 12 weeks (Patient not taking: Reported on 10/17/2016), Disp: 12 capsule, Rfl: 0  No Known Allergies   Review of Systems  Respiratory: Negative for shortness of breath.   Musculoskeletal: Positive for neck pain. Negative for myalgias.  Neurological: Negative for focal weakness.      Objective  Vitals:   10/17/16 0948  BP: 140/75  Pulse: 64  Resp: 16  Temp: (!) 97.5 F (36.4 C)  TempSrc: Oral  SpO2: 97%  Weight: 183 lb 1.6 oz (83.1 kg)  Height: 5\' 8"  (1.727 m)    Physical Exam  Constitutional: He is oriented to person, place, and time and well-developed, well-nourished, and in no distress.  HENT:  Head: Normocephalic and atraumatic.  Cardiovascular: Normal rate, regular rhythm and normal heart sounds.   No murmur heard. Pulmonary/Chest: Effort normal and breath sounds normal. He has no wheezes.  Abdominal: Soft. Bowel sounds are normal. There is no tenderness.  Musculoskeletal: He exhibits no  edema.  Neurological: He is alert and oriented to person, place, and time.  Psychiatric: Mood, memory, affect and judgment normal.  Nursing note and vitals reviewed.   Assessment & Plan  1. Degenerative disc disease, cervical Continue take meloxicam for relief of inflammation associated with degenerative disc disease, no muscle spasm - meloxicam (MOBIC) 15 MG tablet; Take 1 tablet (15 mg total) by mouth daily.  Dispense: 90 tablet; Refill: 0  2. Pure hypercholesterolemia Lies to take atorvastatin as prescribed, obtain FLP - Lipid panel - atorvastatin (LIPITOR) 20 MG tablet; Take 1 tablet (20 mg total) by mouth daily.  Dispense: 90 tablet; Refill:  0  3. Vitamin D insufficiency  - VITAMIN D 25 Hydroxy (Vit-D Deficiency, Fractures)   Nakeita Styles [facility] 10/17/2016 9:52 AM

## 2016-10-18 LAB — LIPID PANEL
CHOL/HDL RATIO: 4.3 ratio (ref <5.0)
Cholesterol: 186 mg/dL (ref <200)
HDL: 43 mg/dL (ref >40)
LDL CALC: 125 mg/dL — AB (ref <100)
TRIGLYCERIDES: 90 mg/dL (ref <150)
VLDL: 18 mg/dL (ref <30)

## 2016-10-18 LAB — VITAMIN D 25 HYDROXY (VIT D DEFICIENCY, FRACTURES): Vit D, 25-Hydroxy: 44 ng/mL (ref 30–100)

## 2017-01-20 ENCOUNTER — Ambulatory Visit: Payer: Managed Care, Other (non HMO) | Admitting: Family Medicine

## 2017-01-22 ENCOUNTER — Ambulatory Visit (INDEPENDENT_AMBULATORY_CARE_PROVIDER_SITE_OTHER): Payer: Managed Care, Other (non HMO) | Admitting: Family Medicine

## 2017-01-22 ENCOUNTER — Encounter: Payer: Self-pay | Admitting: Family Medicine

## 2017-01-22 DIAGNOSIS — E7800 Pure hypercholesterolemia, unspecified: Secondary | ICD-10-CM | POA: Diagnosis not present

## 2017-01-22 DIAGNOSIS — M5030 Other cervical disc degeneration, unspecified cervical region: Secondary | ICD-10-CM

## 2017-01-22 MED ORDER — ATORVASTATIN CALCIUM 20 MG PO TABS: 20.0000 mg | ORAL_TABLET | Freq: Every day | ORAL | 0 refills | Status: AC

## 2017-01-22 MED ORDER — MELOXICAM 15 MG PO TABS
15.0000 mg | ORAL_TABLET | Freq: Every day | ORAL | 0 refills | Status: AC
Start: 2017-01-22 — End: ?

## 2017-01-22 NOTE — Progress Notes (Signed)
Name: Clarence Martinez   MRN: [id number]    DOB: 05/18/1960   Date:01/22/2017       Progress Note  Subjective  Chief Complaint  Chief Complaint  Patient presents with  . Medication Refill    mobic and liptior; asking for tramdol new RX never got it filled  . Hyperlipidemia    f/u  . Neck Pain    f/u; mobic    Hyperlipidemia  This is a recurrent problem. The problem is uncontrolled. Recent lipid tests were reviewed and are high. Pertinent negatives include no leg pain, myalgias or shortness of breath. Current antihyperlipidemic treatment includes statins. The current treatment provides significant improvement of lipids.  Neck Pain   This is a chronic problem. The pain is present in the midline. The quality of the pain is described as cramping (throbbing aching pain in the posterior side of neck). The pain is moderate. Pertinent negatives include no fever, headaches (occasional headaches), leg pain or photophobia. He has tried NSAIDs for the symptoms.     Past Medical History:  Diagnosis Date  . Degenerative disc disease, cervical    no limitations  . Hyperlipidemia     Past Surgical History:  Procedure Laterality Date  . COLONOSCOPY WITH PROPOFOL N/A 05/22/2015   Procedure: COLONOSCOPY WITH PROPOFOL;  Surgeon: Midge Miniumarren Wohl, MD;  [facility];  Service: Endoscopy;  Laterality: N/A;  . NOSE SURGERY  1977    No family history on file.  Social History   Social History  . Marital status: Single    Spouse name: N/A  . Number of children: N/A  . Years of education: N/A   Occupational History  . Not on file.   Social History Main Topics  . Smoking status: Never Smoker  . Smokeless tobacco: Never Used  . Alcohol use 0.0 oz/week     Comment: rare - 1 drink/mo  . Drug use: No  . Sexual activity: Yes    Partners: Female   Other Topics Concern  . Not on file   Social History Narrative  . No narrative on file     Current Outpatient Prescriptions:  .   atorvastatin (LIPITOR) 20 MG tablet, Take 1 tablet (20 mg total) by mouth daily., Disp: 90 tablet, Rfl: 0 .  meloxicam (MOBIC) 15 MG tablet, Take 1 tablet (15 mg total) by mouth daily., Disp: 90 tablet, Rfl: 0 .  traMADol (ULTRAM) 50 MG tablet, , Disp: , Rfl:  .  Vitamin D, Ergocalciferol, (DRISDOL) 50000 units CAPS capsule, Take 1 capsule (50,000 Units total) by mouth once a week. For 12 weeks (Patient not taking: Reported on 10/17/2016), Disp: 12 capsule, Rfl: 0  No Known Allergies   Review of Systems  Constitutional: Negative for fever.  Eyes: Negative for photophobia.  Respiratory: Negative for shortness of breath.   Musculoskeletal: Positive for neck pain. Negative for myalgias.  Neurological: Negative for headaches (occasional headaches).     Objective  Vitals:   01/22/17 1108  BP: 114/76  Pulse: 61  Resp: 14  Temp: 97.9 F (36.6 C)  TempSrc: Oral  SpO2: 96%  Weight: 177 lb 14.4 oz (80.7 kg)    Physical Exam  Constitutional: He is oriented to person, place, and time and well-developed, well-nourished, and in no distress.  HENT:  Head: Normocephalic and atraumatic.  Cardiovascular: Normal rate, regular rhythm and normal heart sounds.   No murmur heard. Pulmonary/Chest: Effort normal and breath sounds normal. He has no wheezes.  Musculoskeletal:  Cervical back: Normal. He exhibits no tenderness.  Neurological: He is alert and oriented to person, place, and time.  Psychiatric: Mood, memory, affect and judgment normal.  Nursing note and vitals reviewed.       Assessment & Plan  1. Degenerative disc disease, cervical Continue to take meloxicam as needed for treatment of neck stiffness and pain associated with degenerative disc disease of the cervical spine, may need referral to spine specialist and feet - meloxicam (MOBIC) 15 MG tablet; Take 1 tablet (15 mg total) by mouth daily.  Dispense: 90 tablet; Refill: 0  2. Pure hypercholesterolemia elevated LDL  from July 2018, continue present management -atorrvastatin (LIPITOR) 20 MG tablet; Take 1 tablet (20 mg total) by mouth daily.  Dispense: 90 tablet; Refill: 0 - Lipid panel   Fabrice Dyal [facility] 01/22/2017 11:19 AM

## 2017-02-26 ENCOUNTER — Other Ambulatory Visit: Payer: Self-pay | Admitting: Family Medicine

## 2017-02-26 DIAGNOSIS — M5030 Other cervical disc degeneration, unspecified cervical region: Secondary | ICD-10-CM

## 2017-02-26 DIAGNOSIS — E7800 Pure hypercholesterolemia, unspecified: Secondary | ICD-10-CM

## 2017-03-03 ENCOUNTER — Other Ambulatory Visit: Payer: Self-pay | Admitting: Family Medicine

## 2017-03-03 DIAGNOSIS — M5030 Other cervical disc degeneration, unspecified cervical region: Secondary | ICD-10-CM

## 2017-03-03 DIAGNOSIS — E7800 Pure hypercholesterolemia, unspecified: Secondary | ICD-10-CM

## 2017-04-24 ENCOUNTER — Ambulatory Visit: Payer: Managed Care, Other (non HMO) | Admitting: Family Medicine
